# Patient Record
Sex: Female | Born: 2000 | Race: White | Hispanic: No | Marital: Single | Smoking: Never smoker
Health system: Southern US, Community
[De-identification: ages and names within clinical notes are randomized; demographics above are authoritative.]

## PROBLEM LIST (undated history)

## (undated) DIAGNOSIS — J40 Bronchitis, not specified as acute or chronic: Secondary | ICD-10-CM

## (undated) HISTORY — PX: ADENOIDECTOMY: SUR15

## (undated) HISTORY — PX: TONSILLECTOMY: SUR1361

---
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
## 2019-03-14 DIAGNOSIS — M25561 Pain in right knee: Secondary | ICD-10-CM | POA: Diagnosis not present

## 2019-03-25 DIAGNOSIS — Z7182 Exercise counseling: Secondary | ICD-10-CM | POA: Diagnosis not present

## 2019-03-25 DIAGNOSIS — M7651 Patellar tendinitis, right knee: Secondary | ICD-10-CM | POA: Diagnosis not present

## 2019-03-25 DIAGNOSIS — M25561 Pain in right knee: Secondary | ICD-10-CM | POA: Diagnosis not present

## 2019-03-25 DIAGNOSIS — Z713 Dietary counseling and surveillance: Secondary | ICD-10-CM | POA: Diagnosis not present

## 2019-03-25 DIAGNOSIS — Z0000 Encounter for general adult medical examination without abnormal findings: Secondary | ICD-10-CM | POA: Diagnosis not present

## 2019-03-25 DIAGNOSIS — Z23 Encounter for immunization: Secondary | ICD-10-CM | POA: Diagnosis not present

## 2019-03-25 DIAGNOSIS — Z68.41 Body mass index (BMI) pediatric, 5th percentile to less than 85th percentile for age: Secondary | ICD-10-CM | POA: Diagnosis not present

## 2019-03-26 DIAGNOSIS — M25561 Pain in right knee: Secondary | ICD-10-CM | POA: Diagnosis not present

## 2019-03-27 DIAGNOSIS — M25561 Pain in right knee: Secondary | ICD-10-CM | POA: Diagnosis not present

## 2019-04-02 DIAGNOSIS — M62830 Muscle spasm of back: Secondary | ICD-10-CM | POA: Diagnosis not present

## 2019-04-02 DIAGNOSIS — M4003 Postural kyphosis, cervicothoracic region: Secondary | ICD-10-CM | POA: Diagnosis not present

## 2019-04-02 DIAGNOSIS — M9901 Segmental and somatic dysfunction of cervical region: Secondary | ICD-10-CM | POA: Diagnosis not present

## 2019-04-02 DIAGNOSIS — M9902 Segmental and somatic dysfunction of thoracic region: Secondary | ICD-10-CM | POA: Diagnosis not present

## 2019-04-16 DIAGNOSIS — Z1159 Encounter for screening for other viral diseases: Secondary | ICD-10-CM | POA: Diagnosis not present

## 2019-04-22 ENCOUNTER — Encounter: Payer: Self-pay | Admitting: Podiatry

## 2019-04-22 ENCOUNTER — Telehealth: Payer: Self-pay | Admitting: Podiatry

## 2019-04-22 NOTE — Telephone Encounter (Signed)
Pt's mother called requesting a note/letter authorizing pt is released to play sports at school. Request letter e-mail be sent to [email or web address].

## 2019-08-06 DIAGNOSIS — Z118 Encounter for screening for other infectious and parasitic diseases: Secondary | ICD-10-CM | POA: Diagnosis not present

## 2019-08-06 DIAGNOSIS — Z01419 Encounter for gynecological examination (general) (routine) without abnormal findings: Secondary | ICD-10-CM | POA: Diagnosis not present

## 2019-08-06 DIAGNOSIS — Z6824 Body mass index (BMI) 24.0-24.9, adult: Secondary | ICD-10-CM | POA: Diagnosis not present

## 2019-08-16 DIAGNOSIS — J0100 Acute maxillary sinusitis, unspecified: Secondary | ICD-10-CM | POA: Diagnosis not present

## 2020-09-17 ENCOUNTER — Ambulatory Visit
Admission: EM | Admit: 2020-09-17 | Discharge: 2020-09-17 | Disposition: A | Discharge status: Home / Self Care | Payer: BC Managed Care – PPO | Attending: Internal Medicine | Admitting: Internal Medicine

## 2020-09-17 ENCOUNTER — Other Ambulatory Visit: Payer: Self-pay

## 2020-09-17 DIAGNOSIS — R0982 Postnasal drip: Secondary | ICD-10-CM

## 2020-09-17 DIAGNOSIS — J309 Allergic rhinitis, unspecified: Secondary | ICD-10-CM | POA: Diagnosis not present

## 2020-09-17 MED ORDER — IPRATROPIUM BROMIDE 0.03 % NA SOLN: 2.0000 | Freq: Two times a day (BID) | NASAL | 12 refills | Status: DC

## 2020-09-17 MED ORDER — BENZONATATE 100 MG PO CAPS: 100.0000 mg | ORAL_CAPSULE | Freq: Three times a day (TID) | ORAL | 0 refills | Status: AC

## 2020-09-17 NOTE — Discharge Instructions (Signed)
Increase oral fluid intake Stop using Sudafed  Please quarantine until COVID-19 test results available If your symptoms worsen please return to the urgent care to be reevaluated Take medications as tolerated We will call you with recommendations if your labs are abnormal.

## 2020-09-17 NOTE — ED Triage Notes (Signed)
Pt said she started having congestion in her nose and in her chest since Monday. Pt said the congestion has gotten worse and more in her chest now. She said he body is achy, but no fevers.

## 2020-09-17 NOTE — ED Provider Notes (Addendum)
[name]    CSN: [id number] Arrival date & time: 09/17/20  1651      History   Chief Complaint Chief Complaint  Patient presents with  . Nasal Congestion  . Cough    HPI Lisa Silva is a 20 y.o. female comes to urgent care with a 4-day history of nasal congestion, greenish nasal discharge and postnasal drip.  Symptoms started insidiously and has gotten progressively worse.  Last night patient had significant amount of coughing affecting her sleep.  She denies any fever or chills.  No loss of taste or smell.  No known sick contact.  No shortness of breath, wheezing or chest tightness.  Patient uses Flonase/azelastine as well as Allegra for allergy symptoms.   HPI  Past Medical History:  Diagnosis Date  . Bronchitis     Patient Active Problem List   Diagnosis Date Noted  . Gastroesophageal reflux disease 06/12/2015  . 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 Sore throat 06/12/2015  . Dysphagia 04/27/2015  . GERD with esophagitis 04/27/2015    Past Surgical History:  Procedure Laterality Date  . ADENOIDECTOMY    . TONSILLECTOMY      OB History   No obstetric history on file.      Home Medications    Prior to Admission medications   Medication Sig Start Date End Date Taking? Authorizing Provider  benzonatate (TESSALON) 100 MG capsule Take 1 capsule (100 mg total) by mouth every 8 (eight) hours. 09/17/20  Yes Zavon Hyson, Britta Mccreedy, MD  ipratropium (ATROVENT) 0.03 % nasal spray Place 2 sprays into both nostrils every 12 (twelve) hours. 09/17/20  Yes Neyra Pettie, Britta Mccreedy, MD  Cholecalciferol (VITAMIN D PO) Take 1 tablet by mouth daily.    [provider]  fexofenadine-pseudoephedrine (ALLEGRA-D 24) 180-240 MG 24 hr tablet Take 1 tablet by mouth as needed.    [provider]  HYDROcodone-acetaminophen (NORCO) 10-325 MG tablet TAKE 1 TABLET BY MOUTH EVERY 4-6 HOURS AS NEEDED FOR PAIN 03/06/18   [provider]  lansoprazole (PREVACID) 15 MG capsule  Take 15 mg by mouth daily at 12 noon.    [provider]  Norgestimate-Eth Estradiol (SPRINTEC 28 PO) Take 1 tablet by mouth daily.    [provider]  ondansetron (ZOFRAN-ODT) 8 MG disintegrating tablet DISSOLVE 1 (ONE) TABLET EVERY 4 TO 6 HOURS AS NEEDED FOR NAUSEA AND VOMITING 03/08/18   [provider]    Family History No family history on file.  Social History Social History   Tobacco Use  . Smoking status: Never Smoker  . Smokeless tobacco: Never Used     Allergies   Patient has no known allergies.   Review of Systems Review of Systems  Constitutional: Negative for activity change, chills, fatigue and fever.  HENT: Positive for congestion, postnasal drip and sneezing. Negative for mouth sores, sore throat and voice change.   Respiratory: Positive for cough. Negative for shortness of breath and wheezing.   Gastrointestinal: Positive for abdominal pain. Negative for diarrhea, nausea and vomiting.     Physical Exam Triage Vital Signs ED Triage Vitals  Enc Vitals Group     BP 09/17/20 1724 (!) 142/93     Pulse Rate 09/17/20 1724 72     Resp 09/17/20 1724 16     Temp 09/17/20 1724 98.1 F (36.7 C)     Temp Source 09/17/20 1724 Oral     SpO2 09/17/20 1724 98 %     Weight 09/17/20 1704 150 lb (  68 kg)     Height 09/17/20 1704 5\' 8"  (1.727 m)     Head Circumference --      Peak Flow --      Pain Score 09/17/20 1703 5     Pain Loc --      Pain Edu? --      Excl. in [place]? --    No data found.  Updated Vital Signs BP (!) 142/93 (BP Location: Right Arm)   Pulse 72   Temp 98.1 F (36.7 C) (Oral)   Resp 16   Ht 5\' 8"  (1.727 m)   Wt 68 kg   LMP 09/07/2020   SpO2 98%   BMI 22.81 kg/m   Visual Acuity Right Eye Distance:   Left Eye Distance:   Bilateral Distance:    Right Eye Near:   Left Eye Near:    Bilateral Near:     Physical Exam Vitals and nursing note reviewed.  Constitutional:      General: She is not in acute  distress.    Appearance: She is not ill-appearing.  HENT:     Right Ear: Tympanic membrane normal.     Left Ear: Tympanic membrane normal.     Mouth/Throat:     Mouth: Mucous membranes are moist.  Cardiovascular:     Rate and Rhythm: Normal rate and regular rhythm.     Pulses: Normal pulses.     Heart sounds: Normal heart sounds.  Pulmonary:     Effort: Pulmonary effort is normal. No respiratory distress.     Breath sounds: Normal breath sounds. No stridor. No wheezing.  Neurological:     Mental Status: She is alert.      UC Treatments / Results  Labs (all labs ordered are listed, but only abnormal results are displayed) Labs Reviewed  COVID-19, FLU A+B NAA    EKG   Radiology No results found.  Procedures Procedures (including critical care time)  Medications Ordered in UC Medications - No data to display  Initial Impression / Assessment and Plan / UC Course  I have reviewed the triage vital signs and the nursing notes.  Pertinent labs & imaging results that were available during my care of the patient were reviewed by me and considered in my medical decision making (see chart for details).     1.  Allergic rhinitis with postnasal drip: Stop Sudafed use We will add ipratropium nasal spray to the treatment regimen Tessalon Perles as needed for cough Take Mucinex regularly If your symptoms persistent for more than 10 days you may need some antibiotics. Final Clinical Impressions(s) / UC Diagnoses   Final diagnoses:  Allergic rhinitis with postnasal drip     Discharge Instructions     Increase oral fluid intake Stop using Sudafed  Please quarantine until COVID-19 test results available If your symptoms worsen please return to the urgent care to be reevaluated Take medications as tolerated We will call you with recommendations if your labs are abnormal.   ED Prescriptions    Medication Sig Dispense Auth. Provider   ipratropium (ATROVENT) 0.03 % nasal  spray Place 2 sprays into both nostrils every 12 (twelve) hours. 30 mL Ife Vitelli, , MD   benzonatate (TESSALON) 100 MG capsule Take 1 capsule (100 mg total) by mouth every 8 (eight) hours. 21 capsule Lusia Greis, 09/09/2020, MD     PDMP not reviewed this encounter.   Britta Mccreedy, MD 09/17/20 Merrilee Jansky    11/15/20, MD 09/17/20  1828  

## 2020-09-23 LAB — COVID-19, FLU A+B NAA
Influenza A, NAA: NOT DETECTED
Influenza B, NAA: NOT DETECTED
SARS-CoV-2, NAA: DETECTED — AB

## 2021-01-28 ENCOUNTER — Other Ambulatory Visit: Payer: Self-pay | Admitting: Otolaryngology

## 2021-06-21 ENCOUNTER — Ambulatory Visit (INDEPENDENT_AMBULATORY_CARE_PROVIDER_SITE_OTHER): Payer: BC Managed Care – PPO

## 2021-06-21 ENCOUNTER — Ambulatory Visit (INDEPENDENT_AMBULATORY_CARE_PROVIDER_SITE_OTHER): Payer: BC Managed Care – PPO | Admitting: Podiatry

## 2021-06-21 ENCOUNTER — Other Ambulatory Visit: Payer: Self-pay

## 2021-06-21 ENCOUNTER — Encounter: Payer: Self-pay | Admitting: Podiatry

## 2021-06-21 DIAGNOSIS — M6740 Ganglion, unspecified site: Secondary | ICD-10-CM | POA: Diagnosis not present

## 2021-06-21 DIAGNOSIS — M25571 Pain in right ankle and joints of right foot: Secondary | ICD-10-CM | POA: Diagnosis not present

## 2021-06-21 NOTE — Progress Notes (Signed)
Subjective:   Patient ID: Lisa Silva, female   DOB: 20 y.o.   MRN: [id number]   HPI Patient presents with pain in the right ankle and states that she had an MRI done indicating cyst formation and it feels like it is pressing on a nerve when she tries to be active especially with volleyball.  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  I applied compression to hopefully prevent any reoccurrence and did discuss possible excision in future  X-rays indicate no signs of calcification or bony injury associated with this condition

## 2021-08-25 ENCOUNTER — Other Ambulatory Visit: Payer: Self-pay

## 2021-08-25 ENCOUNTER — Ambulatory Visit: Payer: BC Managed Care – PPO | Admitting: Podiatry

## 2021-08-25 ENCOUNTER — Encounter: Payer: Self-pay | Admitting: Podiatry

## 2021-08-25 DIAGNOSIS — M6740 Ganglion, unspecified site: Secondary | ICD-10-CM

## 2021-08-26 NOTE — Progress Notes (Signed)
Subjective:   Patient ID: Lisa Silva, female   DOB: 20 y.o.   MRN: [id number]   HPI Patient presents stating these 2 kn came back the right.  They are not hurting like they did before but I like to try to have them drained 1 more time and I know in the future I may have to have them surgically removed   ROS      Objective:  Physical Exam  Neurovascular status intact with 2 small masses on the dorsal lateral aspect of the right ankle 1 measuring 1.5 cm x 1.5 cm the other 1 x 1 cm that are mildly painful when pressed     Assessment:  2 ganglionic cyst right which were drained did well and are just starting to get problems again     Plan:  We are going to try again draining but she does understand this may or may not work for her long-term and today I did a proximal nerve block 100 mg liken Marcaine mixture sterile prep of the area and aspirated with 10 cc syringe getting out a small amount of a gelatinous fluid from each.  I then applied sterile dressing I compressed and advised her on significant compression for the next couple weeks and will be seen back as needed

## 2021-09-15 ENCOUNTER — Encounter: Payer: Self-pay | Admitting: Emergency Medicine

## 2021-09-15 ENCOUNTER — Ambulatory Visit
Admission: EM | Admit: 2021-09-15 | Discharge: 2021-09-15 | Disposition: A | Discharge status: Home / Self Care | Payer: BC Managed Care – PPO | Attending: Emergency Medicine | Admitting: Emergency Medicine

## 2021-09-15 ENCOUNTER — Other Ambulatory Visit: Payer: Self-pay

## 2021-09-15 DIAGNOSIS — J069 Acute upper respiratory infection, unspecified: Secondary | ICD-10-CM | POA: Diagnosis not present

## 2021-09-15 DIAGNOSIS — J0100 Acute maxillary sinusitis, unspecified: Secondary | ICD-10-CM

## 2021-09-15 MED ORDER — AMOXICILLIN 875 MG PO TABS: 875.0000 mg | ORAL_TABLET | Freq: Two times a day (BID) | ORAL | 0 refills | Status: AC

## 2021-09-15 NOTE — ED Triage Notes (Signed)
Pt c/o cough, ST and runny nose x 5 days.

## 2021-09-15 NOTE — ED Provider Notes (Signed)
Lisa Silva    CSN: [id number] Arrival date & time: 09/15/21  1759      History   Chief Complaint Chief Complaint  Patient presents with   Cough   Sore Throat   Nasal Congestion    HPI Lisa Silva is a 21 y.o. female.  Patient presents with 5-day history of postnasal drip, nasal congestion, runny nose, sore throat, cough.  Treatment at home with Mucinex and Zicam.  Her symptoms are getting worse.  She denies fever, rash, shortness of breath, or other symptoms.  Negative COVID test at home.     The history is provided by the patient and medical records.   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      Home Medications    Prior to Admission medications   Medication Sig Start Date End Date Taking? Authorizing Provider  ADDERALL XR 20 MG 24 hr capsule Take 20 mg by mouth daily. 01/12/21  Yes [provider]  amoxicillin (AMOXIL) 875 MG tablet Take 1 tablet (875 mg total) by mouth 2 (two) times daily for 7 days. 09/15/21 09/22/21 Yes Sharion Balloon, NP  ARNUITY ELLIPTA 100 MCG/ACT AEPB INHALE 1 PUFF ONCE DAILY 05/23/21  Yes [provider]  escitalopram (LEXAPRO) 10 MG tablet Take 10 mg by mouth daily. 04/25/21  Yes [provider]  TRI-ESTARYLLA 0.18/0.215/0.25 MG-35 MCG tablet Take 1 tablet by mouth daily. 05/05/21  Yes [provider]  Azelastine-Fluticasone 137-50 MCG/ACT SUSP 1 spray in each nostril 08/28/20   [provider]  benzonatate (TESSALON) 100 MG capsule Take 1 capsule (100 mg total) by mouth every 8 (eight) hours. 09/17/20   LampteyMyrene Galas, MD   Cholecalciferol (VITAMIN D PO) Take 1 tablet by mouth daily.    [provider]  fexofenadine-pseudoephedrine (ALLEGRA-D 24) 180-240 MG 24 hr tablet Take 1 tablet by mouth as needed.    [provider]  HYDROcodone-acetaminophen (NORCO) 10-325 MG tablet TAKE 1 TABLET BY MOUTH EVERY 4-6 HOURS AS NEEDED FOR PAIN 03/06/18   [provider]  ondansetron (ZOFRAN-ODT) 8 MG disintegrating tablet DISSOLVE 1 (ONE) TABLET EVERY 4 TO 6 HOURS AS NEEDED FOR NAUSEA AND VOMITING 03/08/18   [provider]    Family History No family history on file.  Social History Social History   Tobacco Use   Smoking status: Never   Smokeless tobacco: Never  Vaping Use   Vaping Use: Never used  Substance Use Topics   Alcohol use: Not Currently   Drug use: Never     Allergies   Patient has no known allergies.   Review of Systems Review of Systems  Constitutional:  Negative for chills and fever.  HENT:  Positive for congestion, postnasal drip, rhinorrhea and sore throat. Negative for ear pain.   Respiratory:  Positive for cough. Negative for shortness of breath.   Cardiovascular:  Negative for chest pain and palpitations.  Gastrointestinal:  Negative for diarrhea and vomiting.  Skin:  Negative for color change and rash.  All other systems reviewed and are negative.  Physical Exam Triage Vital Signs ED Triage Vitals  Enc Vitals Group     BP      Pulse      Resp      Temp      Temp src      SpO2      Weight      Height      Head Circumference      Peak Flow      Pain Score      Pain Loc      Pain Edu?      Excl. in [place]?    No data found.  Updated Vital Signs BP (!) 134/93 (BP Location: Left Arm)    Pulse (!) 107    Temp 98.7 F (37.1 C) (Oral)    Resp 18    LMP 09/14/2021    SpO2 96%   Visual Acuity Right Eye Distance:   Left Eye Distance:   Bilateral Distance:    Right Eye Near:   Left Eye Near:    Bilateral Near:     Physical Exam Vitals  and nursing note reviewed.  Constitutional:      General: She is not in acute distress.    Appearance: She is well-developed.  HENT:     Right Ear: Tympanic membrane normal.     Left Ear: Tympanic membrane normal.     Nose: Congestion and rhinorrhea present.     Mouth/Throat:     Mouth: Mucous membranes are moist.     Pharynx: Oropharynx is clear.  Cardiovascular:     Rate and Rhythm: Normal rate and regular rhythm.     Heart sounds: Normal heart sounds.  Pulmonary:     Effort: Pulmonary effort is normal. No respiratory distress.     Breath sounds: Normal breath sounds.  Musculoskeletal:     Cervical back: Neck supple.  Skin:    General: Skin is warm and dry.  Neurological:     Mental Status: She is alert.  Psychiatric:        Mood and Affect: Mood normal.        Behavior: Behavior normal.     UC Treatments / Results  Labs (all labs ordered are listed, but only abnormal results are displayed) Labs Reviewed - No data to display  EKG   Radiology No results found.  Procedures Procedures (including critical care time)  Medications Ordered in UC Medications - No data to display  Initial Impression / Assessment and Plan / UC Course  I have reviewed the triage vital signs and the nursing notes.  Pertinent labs & imaging results that were available during my care of the patient were reviewed by me and considered in my medical decision making (see chart for details).    Viral URI. Acute sinusitis.  Instructed patient to continue symptomatic treatment with Tylenol or ibuprofen, plain Mucinex, rest, hydration.  If her symptoms are not improving in 2 to 3 days, start amoxicillin.  Instructed her to follow-up with her PCP if her symptoms are not improving.  She agrees to plan of care.  Final Clinical Impressions(s) / UC Diagnoses   Final diagnoses:  Viral URI  Acute non-recurrent maxillary sinusitis     Discharge Instructions      Continue symptomatic treatment as  discussed.  If you are not improving in 2-3 days, start the amoxicillin.  Follow up with your primary care provider if your symptoms are not improving.  ED Prescriptions     Medication Sig Dispense Auth. Provider   amoxicillin (AMOXIL) 875 MG tablet Take 1 tablet (875 mg total) by mouth 2 (two) times daily for 7 days. 14 tablet Sharion Balloon, NP      PDMP not reviewed this encounter.   Sharion Balloon, NP 09/15/21 [phone]

## 2021-09-15 NOTE — Discharge Instructions (Addendum)
Continue symptomatic treatment as discussed.  If you are not improving in 2-3 days, start the amoxicillin.  Follow up with your primary care provider if your symptoms are not improving.

## 2022-02-28 DIAGNOSIS — M9903 Segmental and somatic dysfunction of lumbar region: Secondary | ICD-10-CM | POA: Diagnosis not present

## 2022-02-28 DIAGNOSIS — M5116 Intervertebral disc disorders with radiculopathy, lumbar region: Secondary | ICD-10-CM | POA: Diagnosis not present

## 2022-02-28 DIAGNOSIS — M25552 Pain in left hip: Secondary | ICD-10-CM | POA: Diagnosis not present

## 2022-02-28 DIAGNOSIS — M9905 Segmental and somatic dysfunction of pelvic region: Secondary | ICD-10-CM | POA: Diagnosis not present

## 2022-03-02 DIAGNOSIS — M9905 Segmental and somatic dysfunction of pelvic region: Secondary | ICD-10-CM | POA: Diagnosis not present

## 2022-03-02 DIAGNOSIS — M5116 Intervertebral disc disorders with radiculopathy, lumbar region: Secondary | ICD-10-CM | POA: Diagnosis not present

## 2022-03-02 DIAGNOSIS — M25552 Pain in left hip: Secondary | ICD-10-CM | POA: Diagnosis not present

## 2022-03-02 DIAGNOSIS — M9903 Segmental and somatic dysfunction of lumbar region: Secondary | ICD-10-CM | POA: Diagnosis not present

## 2022-03-07 DIAGNOSIS — M5116 Intervertebral disc disorders with radiculopathy, lumbar region: Secondary | ICD-10-CM | POA: Diagnosis not present

## 2022-03-07 DIAGNOSIS — M9903 Segmental and somatic dysfunction of lumbar region: Secondary | ICD-10-CM | POA: Diagnosis not present

## 2022-03-07 DIAGNOSIS — M25552 Pain in left hip: Secondary | ICD-10-CM | POA: Diagnosis not present

## 2022-03-07 DIAGNOSIS — M9905 Segmental and somatic dysfunction of pelvic region: Secondary | ICD-10-CM | POA: Diagnosis not present

## 2022-03-10 DIAGNOSIS — M25552 Pain in left hip: Secondary | ICD-10-CM | POA: Diagnosis not present

## 2022-03-10 DIAGNOSIS — M9903 Segmental and somatic dysfunction of lumbar region: Secondary | ICD-10-CM | POA: Diagnosis not present

## 2022-03-10 DIAGNOSIS — M5116 Intervertebral disc disorders with radiculopathy, lumbar region: Secondary | ICD-10-CM | POA: Diagnosis not present

## 2022-03-10 DIAGNOSIS — M9905 Segmental and somatic dysfunction of pelvic region: Secondary | ICD-10-CM | POA: Diagnosis not present

## 2022-03-17 DIAGNOSIS — M5116 Intervertebral disc disorders with radiculopathy, lumbar region: Secondary | ICD-10-CM | POA: Diagnosis not present

## 2022-03-17 DIAGNOSIS — M9905 Segmental and somatic dysfunction of pelvic region: Secondary | ICD-10-CM | POA: Diagnosis not present

## 2022-03-17 DIAGNOSIS — M9903 Segmental and somatic dysfunction of lumbar region: Secondary | ICD-10-CM | POA: Diagnosis not present

## 2022-03-17 DIAGNOSIS — M25552 Pain in left hip: Secondary | ICD-10-CM | POA: Diagnosis not present

## 2022-03-21 DIAGNOSIS — M5459 Other low back pain: Secondary | ICD-10-CM | POA: Diagnosis not present

## 2022-03-21 DIAGNOSIS — M5116 Intervertebral disc disorders with radiculopathy, lumbar region: Secondary | ICD-10-CM | POA: Diagnosis not present

## 2022-03-21 DIAGNOSIS — M25552 Pain in left hip: Secondary | ICD-10-CM | POA: Diagnosis not present

## 2022-03-21 DIAGNOSIS — M9903 Segmental and somatic dysfunction of lumbar region: Secondary | ICD-10-CM | POA: Diagnosis not present

## 2022-03-21 DIAGNOSIS — M9905 Segmental and somatic dysfunction of pelvic region: Secondary | ICD-10-CM | POA: Diagnosis not present

## 2022-03-23 DIAGNOSIS — M25552 Pain in left hip: Secondary | ICD-10-CM | POA: Diagnosis not present

## 2022-03-23 DIAGNOSIS — M5116 Intervertebral disc disorders with radiculopathy, lumbar region: Secondary | ICD-10-CM | POA: Diagnosis not present

## 2022-03-23 DIAGNOSIS — M9903 Segmental and somatic dysfunction of lumbar region: Secondary | ICD-10-CM | POA: Diagnosis not present

## 2022-03-23 DIAGNOSIS — M9905 Segmental and somatic dysfunction of pelvic region: Secondary | ICD-10-CM | POA: Diagnosis not present

## 2022-03-29 DIAGNOSIS — M25552 Pain in left hip: Secondary | ICD-10-CM | POA: Diagnosis not present

## 2022-03-29 DIAGNOSIS — M9905 Segmental and somatic dysfunction of pelvic region: Secondary | ICD-10-CM | POA: Diagnosis not present

## 2022-03-29 DIAGNOSIS — M5116 Intervertebral disc disorders with radiculopathy, lumbar region: Secondary | ICD-10-CM | POA: Diagnosis not present

## 2022-03-29 DIAGNOSIS — M9903 Segmental and somatic dysfunction of lumbar region: Secondary | ICD-10-CM | POA: Diagnosis not present

## 2022-04-04 DIAGNOSIS — M9903 Segmental and somatic dysfunction of lumbar region: Secondary | ICD-10-CM | POA: Diagnosis not present

## 2022-04-04 DIAGNOSIS — M25552 Pain in left hip: Secondary | ICD-10-CM | POA: Diagnosis not present

## 2022-04-04 DIAGNOSIS — M5116 Intervertebral disc disorders with radiculopathy, lumbar region: Secondary | ICD-10-CM | POA: Diagnosis not present

## 2022-04-04 DIAGNOSIS — M9905 Segmental and somatic dysfunction of pelvic region: Secondary | ICD-10-CM | POA: Diagnosis not present

## 2022-04-11 DIAGNOSIS — M5116 Intervertebral disc disorders with radiculopathy, lumbar region: Secondary | ICD-10-CM | POA: Diagnosis not present

## 2022-04-11 DIAGNOSIS — M9903 Segmental and somatic dysfunction of lumbar region: Secondary | ICD-10-CM | POA: Diagnosis not present

## 2022-04-11 DIAGNOSIS — M9905 Segmental and somatic dysfunction of pelvic region: Secondary | ICD-10-CM | POA: Diagnosis not present

## 2022-04-11 DIAGNOSIS — M25552 Pain in left hip: Secondary | ICD-10-CM | POA: Diagnosis not present

## 2022-04-14 DIAGNOSIS — M9905 Segmental and somatic dysfunction of pelvic region: Secondary | ICD-10-CM | POA: Diagnosis not present

## 2022-04-14 DIAGNOSIS — Z0142 Encounter for cervical smear to confirm findings of recent normal smear following initial abnormal smear: Secondary | ICD-10-CM | POA: Diagnosis not present

## 2022-04-14 DIAGNOSIS — M25552 Pain in left hip: Secondary | ICD-10-CM | POA: Diagnosis not present

## 2022-04-14 DIAGNOSIS — M9903 Segmental and somatic dysfunction of lumbar region: Secondary | ICD-10-CM | POA: Diagnosis not present

## 2022-04-14 DIAGNOSIS — Z118 Encounter for screening for other infectious and parasitic diseases: Secondary | ICD-10-CM | POA: Diagnosis not present

## 2022-04-14 DIAGNOSIS — M5116 Intervertebral disc disorders with radiculopathy, lumbar region: Secondary | ICD-10-CM | POA: Diagnosis not present

## 2022-04-14 DIAGNOSIS — Z01419 Encounter for gynecological examination (general) (routine) without abnormal findings: Secondary | ICD-10-CM | POA: Diagnosis not present

## 2022-06-27 DIAGNOSIS — M545 Low back pain, unspecified: Secondary | ICD-10-CM | POA: Diagnosis not present

## 2022-10-03 DIAGNOSIS — K219 Gastro-esophageal reflux disease without esophagitis: Secondary | ICD-10-CM | POA: Diagnosis not present

## 2022-10-03 DIAGNOSIS — R112 Nausea with vomiting, unspecified: Secondary | ICD-10-CM | POA: Diagnosis not present

## 2022-12-06 DIAGNOSIS — R109 Unspecified abdominal pain: Secondary | ICD-10-CM | POA: Diagnosis not present

## 2022-12-19 DIAGNOSIS — R109 Unspecified abdominal pain: Secondary | ICD-10-CM | POA: Diagnosis not present

## 2022-12-29 DIAGNOSIS — R109 Unspecified abdominal pain: Secondary | ICD-10-CM | POA: Diagnosis not present

## 2023-01-03 DIAGNOSIS — K219 Gastro-esophageal reflux disease without esophagitis: Secondary | ICD-10-CM | POA: Diagnosis not present

## 2023-01-03 DIAGNOSIS — Z0000 Encounter for general adult medical examination without abnormal findings: Secondary | ICD-10-CM | POA: Diagnosis not present

## 2023-01-03 DIAGNOSIS — Z119 Encounter for screening for infectious and parasitic diseases, unspecified: Secondary | ICD-10-CM | POA: Diagnosis not present

## 2023-01-03 DIAGNOSIS — Z1331 Encounter for screening for depression: Secondary | ICD-10-CM | POA: Diagnosis not present

## 2023-01-14 DIAGNOSIS — J069 Acute upper respiratory infection, unspecified: Secondary | ICD-10-CM | POA: Diagnosis not present

## 2023-01-31 DIAGNOSIS — R1010 Upper abdominal pain, unspecified: Secondary | ICD-10-CM | POA: Diagnosis not present

## 2023-01-31 DIAGNOSIS — R195 Other fecal abnormalities: Secondary | ICD-10-CM | POA: Diagnosis not present

## 2023-01-31 DIAGNOSIS — R935 Abnormal findings on diagnostic imaging of other abdominal regions, including retroperitoneum: Secondary | ICD-10-CM | POA: Diagnosis not present

## 2023-02-08 DIAGNOSIS — Z3200 Encounter for pregnancy test, result unknown: Secondary | ICD-10-CM | POA: Diagnosis not present

## 2023-02-24 DIAGNOSIS — L2089 Other atopic dermatitis: Secondary | ICD-10-CM | POA: Diagnosis not present

## 2023-02-24 DIAGNOSIS — J300 Vasomotor rhinitis: Secondary | ICD-10-CM | POA: Diagnosis not present

## 2023-02-24 DIAGNOSIS — J45990 Exercise induced bronchospasm: Secondary | ICD-10-CM | POA: Diagnosis not present

## 2023-02-24 DIAGNOSIS — Z3200 Encounter for pregnancy test, result unknown: Secondary | ICD-10-CM | POA: Diagnosis not present

## 2023-03-01 DIAGNOSIS — K6389 Other specified diseases of intestine: Secondary | ICD-10-CM | POA: Diagnosis not present

## 2023-03-01 DIAGNOSIS — K529 Noninfective gastroenteritis and colitis, unspecified: Secondary | ICD-10-CM | POA: Diagnosis not present

## 2023-03-01 DIAGNOSIS — K5289 Other specified noninfective gastroenteritis and colitis: Secondary | ICD-10-CM | POA: Diagnosis not present

## 2023-03-01 DIAGNOSIS — K648 Other hemorrhoids: Secondary | ICD-10-CM | POA: Diagnosis not present

## 2023-03-01 DIAGNOSIS — R933 Abnormal findings on diagnostic imaging of other parts of digestive tract: Secondary | ICD-10-CM | POA: Diagnosis not present

## 2023-03-21 ENCOUNTER — Telehealth: Payer: Self-pay | Admitting: Internal Medicine

## 2023-03-21 NOTE — Telephone Encounter (Signed)
Patients mother called to request a transfer of care, requested records for review. The patient had a colonoscopy done 6/24.

## 2023-04-17 ENCOUNTER — Other Ambulatory Visit (HOSPITAL_COMMUNITY): Payer: Self-pay | Admitting: Gastroenterology

## 2023-04-17 DIAGNOSIS — R1010 Upper abdominal pain, unspecified: Secondary | ICD-10-CM

## 2023-04-17 DIAGNOSIS — R110 Nausea: Secondary | ICD-10-CM | POA: Diagnosis not present

## 2023-04-17 DIAGNOSIS — R195 Other fecal abnormalities: Secondary | ICD-10-CM | POA: Diagnosis not present

## 2023-04-17 DIAGNOSIS — R1013 Epigastric pain: Secondary | ICD-10-CM | POA: Diagnosis not present

## 2023-04-21 DIAGNOSIS — R195 Other fecal abnormalities: Secondary | ICD-10-CM | POA: Diagnosis not present

## 2023-04-26 DIAGNOSIS — R000 Tachycardia, unspecified: Secondary | ICD-10-CM | POA: Diagnosis not present

## 2023-04-26 DIAGNOSIS — Z6824 Body mass index (BMI) 24.0-24.9, adult: Secondary | ICD-10-CM | POA: Diagnosis not present

## 2023-04-26 DIAGNOSIS — F900 Attention-deficit hyperactivity disorder, predominantly inattentive type: Secondary | ICD-10-CM | POA: Diagnosis not present

## 2023-04-26 DIAGNOSIS — I498 Other specified cardiac arrhythmias: Secondary | ICD-10-CM | POA: Diagnosis not present

## 2023-04-26 DIAGNOSIS — R0600 Dyspnea, unspecified: Secondary | ICD-10-CM | POA: Diagnosis not present

## 2023-04-27 DIAGNOSIS — R0609 Other forms of dyspnea: Secondary | ICD-10-CM | POA: Diagnosis not present

## 2023-04-27 DIAGNOSIS — R0600 Dyspnea, unspecified: Secondary | ICD-10-CM | POA: Diagnosis not present

## 2023-04-27 DIAGNOSIS — R0689 Other abnormalities of breathing: Secondary | ICD-10-CM | POA: Diagnosis not present

## 2023-04-27 DIAGNOSIS — I498 Other specified cardiac arrhythmias: Secondary | ICD-10-CM | POA: Diagnosis not present

## 2023-04-28 DIAGNOSIS — R9439 Abnormal result of other cardiovascular function study: Secondary | ICD-10-CM | POA: Diagnosis not present

## 2023-04-28 DIAGNOSIS — I498 Other specified cardiac arrhythmias: Secondary | ICD-10-CM | POA: Diagnosis not present

## 2023-04-30 DIAGNOSIS — I498 Other specified cardiac arrhythmias: Secondary | ICD-10-CM | POA: Diagnosis not present

## 2023-04-30 DIAGNOSIS — R9439 Abnormal result of other cardiovascular function study: Secondary | ICD-10-CM | POA: Diagnosis not present

## 2023-05-05 ENCOUNTER — Ambulatory Visit (HOSPITAL_COMMUNITY): Payer: BC Managed Care – PPO

## 2023-05-05 ENCOUNTER — Encounter (HOSPITAL_COMMUNITY): Payer: Self-pay

## 2023-05-18 DIAGNOSIS — R000 Tachycardia, unspecified: Secondary | ICD-10-CM | POA: Diagnosis not present

## 2023-06-02 DIAGNOSIS — R109 Unspecified abdominal pain: Secondary | ICD-10-CM | POA: Diagnosis not present

## 2023-07-28 DIAGNOSIS — K5900 Constipation, unspecified: Secondary | ICD-10-CM | POA: Diagnosis not present

## 2023-08-04 DIAGNOSIS — F900 Attention-deficit hyperactivity disorder, predominantly inattentive type: Secondary | ICD-10-CM | POA: Diagnosis not present

## 2023-08-20 DIAGNOSIS — J988 Other specified respiratory disorders: Secondary | ICD-10-CM | POA: Diagnosis not present

## 2023-08-20 DIAGNOSIS — Z719 Counseling, unspecified: Secondary | ICD-10-CM | POA: Diagnosis not present

## 2023-08-20 DIAGNOSIS — R0981 Nasal congestion: Secondary | ICD-10-CM | POA: Diagnosis not present

## 2023-08-23 DIAGNOSIS — Z111 Encounter for screening for respiratory tuberculosis: Secondary | ICD-10-CM | POA: Diagnosis not present

## 2023-08-23 DIAGNOSIS — F411 Generalized anxiety disorder: Secondary | ICD-10-CM | POA: Diagnosis not present

## 2023-08-23 DIAGNOSIS — R051 Acute cough: Secondary | ICD-10-CM | POA: Diagnosis not present

## 2023-08-29 DIAGNOSIS — Z1331 Encounter for screening for depression: Secondary | ICD-10-CM | POA: Diagnosis not present

## 2023-08-29 DIAGNOSIS — Z01419 Encounter for gynecological examination (general) (routine) without abnormal findings: Secondary | ICD-10-CM | POA: Diagnosis not present

## 2023-08-29 DIAGNOSIS — Z118 Encounter for screening for other infectious and parasitic diseases: Secondary | ICD-10-CM | POA: Diagnosis not present

## 2023-09-19 DIAGNOSIS — R1310 Dysphagia, unspecified: Secondary | ICD-10-CM | POA: Diagnosis not present

## 2023-09-19 DIAGNOSIS — K2960 Other gastritis without bleeding: Secondary | ICD-10-CM | POA: Diagnosis not present

## 2023-09-22 DIAGNOSIS — K209 Esophagitis, unspecified without bleeding: Secondary | ICD-10-CM | POA: Diagnosis not present

## 2023-09-22 DIAGNOSIS — R1310 Dysphagia, unspecified: Secondary | ICD-10-CM | POA: Diagnosis not present

## 2023-09-22 DIAGNOSIS — K2930 Chronic superficial gastritis without bleeding: Secondary | ICD-10-CM | POA: Diagnosis not present

## 2023-12-02 DIAGNOSIS — J0191 Acute recurrent sinusitis, unspecified: Secondary | ICD-10-CM | POA: Diagnosis not present

## 2024-01-15 DIAGNOSIS — M20002 Unspecified deformity of left finger(s): Secondary | ICD-10-CM | POA: Diagnosis not present

## 2024-01-19 DIAGNOSIS — J019 Acute sinusitis, unspecified: Secondary | ICD-10-CM | POA: Diagnosis not present

## 2024-01-25 DIAGNOSIS — K200 Eosinophilic esophagitis: Secondary | ICD-10-CM | POA: Diagnosis not present

## 2024-01-25 DIAGNOSIS — F458 Other somatoform disorders: Secondary | ICD-10-CM | POA: Diagnosis not present

## 2024-01-25 DIAGNOSIS — K219 Gastro-esophageal reflux disease without esophagitis: Secondary | ICD-10-CM | POA: Diagnosis not present

## 2024-01-26 DIAGNOSIS — M79645 Pain in left finger(s): Secondary | ICD-10-CM | POA: Diagnosis not present

## 2024-02-04 DIAGNOSIS — R300 Dysuria: Secondary | ICD-10-CM | POA: Diagnosis not present

## 2024-02-12 DIAGNOSIS — M25642 Stiffness of left hand, not elsewhere classified: Secondary | ICD-10-CM | POA: Diagnosis not present

## 2024-02-19 DIAGNOSIS — M25642 Stiffness of left hand, not elsewhere classified: Secondary | ICD-10-CM | POA: Diagnosis not present

## 2024-03-08 DIAGNOSIS — M79645 Pain in left finger(s): Secondary | ICD-10-CM | POA: Diagnosis not present

## 2024-03-22 DIAGNOSIS — M25642 Stiffness of left hand, not elsewhere classified: Secondary | ICD-10-CM | POA: Diagnosis not present

## 2024-05-31 DIAGNOSIS — K200 Eosinophilic esophagitis: Secondary | ICD-10-CM | POA: Diagnosis not present

## 2024-05-31 DIAGNOSIS — K900 Celiac disease: Secondary | ICD-10-CM | POA: Diagnosis not present

## 2024-08-28 DIAGNOSIS — J209 Acute bronchitis, unspecified: Secondary | ICD-10-CM | POA: Diagnosis not present

## 2024-08-28 DIAGNOSIS — R051 Acute cough: Secondary | ICD-10-CM | POA: Diagnosis not present

## 2024-08-28 DIAGNOSIS — R0981 Nasal congestion: Secondary | ICD-10-CM | POA: Diagnosis not present

## 2024-08-28 DIAGNOSIS — J060 Acute laryngopharyngitis: Secondary | ICD-10-CM | POA: Diagnosis not present

## 2024-08-28 DIAGNOSIS — U071 COVID-19: Secondary | ICD-10-CM | POA: Diagnosis not present

## 2024-08-31 DIAGNOSIS — R350 Frequency of micturition: Secondary | ICD-10-CM | POA: Diagnosis not present

## 2024-08-31 DIAGNOSIS — R3915 Urgency of urination: Secondary | ICD-10-CM | POA: Diagnosis not present

## 2024-08-31 DIAGNOSIS — R300 Dysuria: Secondary | ICD-10-CM | POA: Diagnosis not present
